# Patient Record
Sex: Male | Born: 1985 | Race: White | Hispanic: No | Marital: Married | Smoking: Never smoker
Health system: Southern US, Community
[De-identification: ages and names within clinical notes are randomized; demographics above are authoritative.]

---

## 2018-04-17 ENCOUNTER — Emergency Department (HOSPITAL_COMMUNITY): Payer: No Typology Code available for payment source

## 2018-04-17 ENCOUNTER — Emergency Department (HOSPITAL_COMMUNITY)
Admission: EM | Admit: 2018-04-17 | Discharge: 2018-04-17 | Disposition: A | Discharge status: Home / Self Care | Payer: No Typology Code available for payment source | Attending: Emergency Medicine | Admitting: Emergency Medicine

## 2018-04-17 ENCOUNTER — Encounter (HOSPITAL_COMMUNITY): Payer: Self-pay | Admitting: Emergency Medicine

## 2018-04-17 DIAGNOSIS — F419 Anxiety disorder, unspecified: Secondary | ICD-10-CM | POA: Insufficient documentation

## 2018-04-17 DIAGNOSIS — R110 Nausea: Secondary | ICD-10-CM

## 2018-04-17 DIAGNOSIS — Y92410 Unspecified street and highway as the place of occurrence of the external cause: Secondary | ICD-10-CM | POA: Insufficient documentation

## 2018-04-17 DIAGNOSIS — Y9389 Activity, other specified: Secondary | ICD-10-CM | POA: Insufficient documentation

## 2018-04-17 DIAGNOSIS — Y999 Unspecified external cause status: Secondary | ICD-10-CM | POA: Insufficient documentation

## 2018-04-17 DIAGNOSIS — R0789 Other chest pain: Secondary | ICD-10-CM | POA: Diagnosis not present

## 2018-04-17 MED ORDER — NAPROXEN 500 MG PO TABS: 500.0000 mg | ORAL_TABLET | Freq: Two times a day (BID) | ORAL | 0 refills | Status: DC | PRN

## 2018-04-17 MED ORDER — ONDANSETRON 4 MG PO TBDP: 4.0000 mg | ORAL_TABLET | Freq: Three times a day (TID) | ORAL | 0 refills | Status: DC | PRN
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
## 2018-07-30 ENCOUNTER — Ambulatory Visit: Payer: No Typology Code available for payment source | Admitting: Family Medicine

## 2018-08-13 ENCOUNTER — Ambulatory Visit: Payer: No Typology Code available for payment source | Admitting: Family Medicine
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
## 2019-04-27 ENCOUNTER — Ambulatory Visit (INDEPENDENT_AMBULATORY_CARE_PROVIDER_SITE_OTHER): Payer: No Typology Code available for payment source | Admitting: Family Medicine

## 2019-04-27 ENCOUNTER — Encounter: Payer: Self-pay | Admitting: Family Medicine

## 2019-04-27 ENCOUNTER — Other Ambulatory Visit: Payer: Self-pay

## 2019-04-27 VITALS — BP 118/72 | HR 88 | Temp 98.8°F | Resp 12 | Ht 72.0 in | Wt 153.6 lb

## 2019-04-27 DIAGNOSIS — F410 Panic disorder [episodic paroxysmal anxiety] without agoraphobia: Secondary | ICD-10-CM | POA: Insufficient documentation

## 2019-04-27 DIAGNOSIS — Z130 Encounter for screening for diseases of the blood and blood-forming organs and certain disorders involving the immune mechanism: Secondary | ICD-10-CM

## 2019-04-27 DIAGNOSIS — Z13228 Encounter for screening for other metabolic disorders: Secondary | ICD-10-CM | POA: Diagnosis not present

## 2019-04-27 DIAGNOSIS — Z13220 Encounter for screening for lipoid disorders: Secondary | ICD-10-CM

## 2019-04-27 DIAGNOSIS — R222 Localized swelling, mass and lump, trunk: Secondary | ICD-10-CM | POA: Diagnosis not present

## 2019-04-27 DIAGNOSIS — Z0000 Encounter for general adult medical examination without abnormal findings: Secondary | ICD-10-CM

## 2019-04-27 DIAGNOSIS — R7989 Other specified abnormal findings of blood chemistry: Secondary | ICD-10-CM

## 2019-04-27 DIAGNOSIS — F411 Generalized anxiety disorder: Secondary | ICD-10-CM

## 2019-04-27 DIAGNOSIS — Z1329 Encounter for screening for other suspected endocrine disorder: Secondary | ICD-10-CM | POA: Diagnosis not present

## 2019-04-27 LAB — BASIC METABOLIC PANEL
BUN: 15 mg/dL (ref 6–23)
CO2: 30 mEq/L (ref 19–32)
Calcium: 10.1 mg/dL (ref 8.4–10.5)
Chloride: 101 mEq/L (ref 96–112)
Creatinine, Ser: 0.99 mg/dL (ref 0.40–1.50)
GFR: 88.12 mL/min (ref >60.00)
Glucose, Bld: 95 mg/dL (ref 70–99)
Potassium: 4.1 mEq/L (ref 3.5–5.1)
Sodium: 140 mEq/L (ref 135–145)

## 2019-04-27 LAB — CBC WITH DIFFERENTIAL/PLATELET
Basophils Absolute: 0 10*3/uL (ref 0.0–0.1)
Basophils Relative: 0.4 % (ref 0.0–3.0)
Eosinophils Absolute: 0 10*3/uL (ref 0.0–0.7)
Eosinophils Relative: 0.3 % (ref 0.0–5.0)
HCT: 49.4 % (ref 39.0–52.0)
Hemoglobin: 17.2 g/dL — ABNORMAL HIGH (ref 13.0–17.0)
Lymphocytes Relative: 16.6 % (ref 12.0–46.0)
Lymphs Abs: 1.8 10*3/uL (ref 0.7–4.0)
MCHC: 34.8 g/dL (ref 30.0–36.0)
MCV: 88.3 fl (ref 78.0–100.0)
Monocytes Absolute: 0.8 10*3/uL (ref 0.1–1.0)
Monocytes Relative: 7.7 % (ref 3.0–12.0)
Neutro Abs: 8 10*3/uL — ABNORMAL HIGH (ref 1.4–7.7)
Neutrophils Relative %: 75 % (ref 43.0–77.0)
Platelets: 181 10*3/uL (ref 150.0–400.0)
RBC: 5.59 Mil/uL (ref 4.22–5.81)
RDW: 12.8 % (ref 11.5–15.5)
WBC: 10.6 10*3/uL — ABNORMAL HIGH (ref 4.0–10.5)

## 2019-04-27 LAB — LIPID PANEL
Cholesterol: 144 mg/dL (ref 0–200)
HDL: 56.6 mg/dL (ref >39.00)
LDL Cholesterol: 69 mg/dL (ref 0–99)
NonHDL: 86.97
Total CHOL/HDL Ratio: 3
Triglycerides: 88 mg/dL (ref 0.0–149.0)
VLDL: 17.6 mg/dL (ref 0.0–40.0)

## 2019-04-27 LAB — HEMOGLOBIN A1C: Hgb A1c MFr Bld: 4.6 % (ref 4.6–6.5)

## 2019-04-27 NOTE — Assessment & Plan Note (Signed)
Reporting problem as stable. He will benefit from cognitive behavior intervention. Continue following with psychiatrist.

## 2019-04-27 NOTE — Progress Notes (Signed)
HPI:   Jordan Neal is a 33 y.o. male, who is here today to establish care.  Former PCP: N/A Last preventive routine visit: At age 33  Chronic medical problems: Anxiety, fear to health problems.  States that he avoids going to the doctor because she is afraid something will be found. Currently he is on Xanax 0.5 mg daily as needed, he takes medication very seldom. He follows with psychiatrist periodically.  Otherwise healthy, negative for history of hypertension, hyperlipidemia, or diabetes.  Concerns today: None. He would like to have a CPE today.  He lives with his wife. He exercises regularly and follows a healthful diet. Because of family history of diabetes, he would like to have some labs today. No history of STDs.   On examination noted left supraclavicular fossa fullness, not noted by patient before.  He mentioned that for the past few days he has some achiness on area. Denies fever, chills, night sweats, or abnormal weight loss. Negative for cough, wheezing, dyspnea, chest pain.  Denies history of tobacco use or high alcohol intake.  He states that he feels "great."  Review of Systems  Constitutional: Negative for activity change, appetite change, fatigue, fever and unexpected weight change.  HENT: Negative for dental problem, nosebleeds, sore throat, trouble swallowing and voice change.   Eyes: Negative for redness and visual disturbance.  Respiratory: Negative for cough, shortness of breath and wheezing.   Cardiovascular: Negative for chest pain, palpitations and leg swelling.  Gastrointestinal: Negative for abdominal pain, blood in stool, nausea and vomiting.  Endocrine: Negative for cold intolerance, heat intolerance, polydipsia, polyphagia and polyuria.  Genitourinary: Negative for decreased urine volume, dysuria, genital sores, hematuria and testicular pain.  Musculoskeletal: Negative for joint swelling and myalgias.  Skin: Negative for color  change and rash.  Allergic/Immunologic: Negative for environmental allergies.  Neurological: Negative for dizziness, syncope, weakness, numbness and headaches.  Hematological: Negative for adenopathy. Does not bruise/bleed easily.  Psychiatric/Behavioral: Positive for sleep disturbance (Attributed to stress.). Negative for confusion. The patient is nervous/anxious.    Current Outpatient Medications on File Prior to Visit  Medication Sig Dispense Refill  . ALPRAZolam (XANAX) 0.5 MG tablet Take 0.5 mg by mouth daily as needed for anxiety.    . Melatonin 3 MG TABS Take by mouth at bedtime.     No current facility-administered medications on file prior to visit.      History reviewed. No pertinent past medical history. No Known Allergies  Family History  Problem Relation Age of Onset  . Diabetes Mother   . Cancer Father   . Heart attack Father   . Hyperlipidemia Father   . Arthritis Maternal Grandmother   . Depression Maternal Grandfather   . Diabetes Maternal Grandfather   . Hearing loss Maternal Grandfather   . Drug abuse Paternal Grandmother   . Hearing loss Paternal Grandmother   . Drug abuse Paternal Grandfather   . Heart attack Paternal Grandfather     Social History   Socioeconomic History  . Marital status: Married    Spouse name: Not on file  . Number of children: Not on file  . Years of education: Not on file  . Highest education level: Not on file  Occupational History  . Not on file  Social Needs  . Financial resource strain: Not on file  . Food insecurity    Worry: Not on file    Inability: Not on file  . Transportation needs  Medical: Not on file    Non-medical: Not on file  Tobacco Use  . Smoking status: Never Smoker  . Smokeless tobacco: Never Used  Substance and Sexual Activity  . Alcohol use: Yes    Comment: occasionally  . Drug use: Never  . Sexual activity: Not Currently  Lifestyle  . Physical activity    Days per week: Not on file     Minutes per session: Not on file  . Stress: Not on file  Relationships  . Social Herbalist on phone: Not on file    Gets together: Not on file    Attends religious service: Not on file    Active member of club or organization: Not on file    Attends meetings of clubs or organizations: Not on file    Relationship status: Not on file  Other Topics Concern  . Not on file  Social History Narrative  . Not on file    Vitals:   04/27/19 1040  BP: 118/72  Pulse: 88  Resp: 12  Temp: 98.8 F (37.1 C)  SpO2: 98%    Body mass index is 20.83 kg/m.  Physical Exam  Nursing note and vitals reviewed. Constitutional: He is oriented to person, place, and time. He appears well-developed and well-nourished. No distress.  HENT:  Head: Atraumatic.  Right Ear: Tympanic membrane, external ear and ear canal normal.  Left Ear: Tympanic membrane, external ear and ear canal normal.  Mouth/Throat: Oropharynx is clear and moist and mucous membranes are normal.  Eyes: Pupils are equal, round, and reactive to light. Conjunctivae and EOM are normal.  Neck: Normal range of motion. No tracheal deviation present. No thyromegaly present.  Cardiovascular: Normal rate and regular rhythm.  No murmur heard. Pulses:      Dorsalis pedis pulses are 2+ on the right side and 2+ on the left side.    Respiratory: Effort normal and breath sounds normal. No respiratory distress.  GI: Soft. He exhibits no mass. There is no hepatomegaly. There is no abdominal tenderness.  Genitourinary:    Genitourinary Comments: Refused,no concerns.   Musculoskeletal:        General: No tenderness or edema.     Cervical back: He exhibits spasm. He exhibits no edema.       Back:     Comments: No major deformities appreciated and no signs of synovitis.  Lymphadenopathy:    He has no cervical adenopathy.       Right: No supraclavicular adenopathy present.       Left: No supraclavicular (Fullness, no masses palpated.)  adenopathy present.  Neurological: He is alert and oriented to person, place, and time. He has normal strength. No cranial nerve deficit or sensory deficit. Coordination and gait normal.  Reflex Scores:      Bicep reflexes are 2+ on the right side and 2+ on the left side.      Patellar reflexes are 2+ on the right side and 2+ on the left side. Skin: Skin is warm. No erythema.  Psychiatric: His mood appears anxious. Cognition and memory are normal.    ASSESSMENT AND PLAN:  Jordan Neal was seen today for establish care and annual exam.  Orders Placed This Encounter  Procedures  . US SOFT TISSUE HEAD & NECK (NON-THYROID)  . Basic metabolic panel  . CBC with Differential  . Hemoglobin A1c  . Lipid panel   Lab Results  Component Value Date   CHOL 144 04/27/2019  HDL 56.60 04/27/2019   LDLCALC 69 04/27/2019   TRIG 88.0 04/27/2019   CHOLHDL 3 04/27/2019   Lab Results  Component Value Date   WBC 10.6 (H) 04/27/2019   HGB 17.2 (H) 04/27/2019   HCT 49.4 04/27/2019   MCV 88.3 04/27/2019   PLT 181.0 04/27/2019   Lab Results  Component Value Date   CREATININE 0.99 04/27/2019   BUN 15 04/27/2019   NA 140 04/27/2019   K 4.1 04/27/2019   CL 101 04/27/2019   CO2 30 04/27/2019   Lab Results  Component Value Date   WBC 10.6 (H) 04/27/2019   HGB 17.2 (H) 04/27/2019   HCT 49.4 04/27/2019   MCV 88.3 04/27/2019   PLT 181.0 04/27/2019   Lab Results  Component Value Date   HGBA1C 4.6 04/27/2019     Routine general medical examination at a health care facility We discussed the importance of regular physical activity and healthy diet for prevention of chronic illness and/or complications. Preventive guidelines reviewed. Vaccination up to date.  Next CPE in a year.  Supraclavicular fossa fullness -     CBC with Differential -     US SOFT TISSUE HEAD & NECK (NON-THYROID); Future  Screening for lipoid disorders -     Lipid panel  Screening for endocrine, metabolic and  immunity disorder -     Basic metabolic panel -     Hemoglobin A1c  Generalized anxiety disorder with panic attacks Reporting problem as stable. He will benefit from cognitive behavior intervention. Continue following with psychiatrist.   Return in 1 year (on 04/26/2020) for CPE.     Betty G. Swazil[place]Jordan, MD  [facility]. [place] office.

## 2019-04-27 NOTE — Patient Instructions (Signed)
A few things to remember from today's visit:   Supraclavicular fossa fullness - Plan: CBC with Differential  Routine general medical examination at a health care facility  Screening for lipoid disorders - Plan: Lipid panel  Screening for endocrine, metabolic and immunity disorder - Plan: Basic metabolic panel, Hemoglobin A1c    At least 150 minutes of moderate exercise per week, daily brisk walking for 15-30 min is a good exercise option. Healthy diet low in saturated (animal) fats and sweets and consisting of fresh fruits and vegetables, lean meats such as fish and white chicken and whole grains.  - Vaccines:  Tdap vaccine every 10 years.  Shingles vaccine recommended at age 76, could be given after 31 years of age but not sure about insurance coverage.  Pneumonia vaccines:  Prevnar 74 at 46 and Pneumovax at 6.   -Screening recommendations for low/normal risk males:  Screening for diabetes at age 2 and every 3 years. Earlier screening if cardiovascular risk factors.   Lipid screening at 35 and every 3 years. Screening starts in younger males with cardiovascular risk factors.  Colon cancer screening at age 81 and until age 72.  Prostate cancer screening: some controversy, starts usually at 14: Rectal exam and PSA.  Aortic Abdominal Aneurism once between 33 and 23 years old if ever smoker.  Also recommended:  1. Dental visit- Brush and floss your teeth twice daily; visit your dentist twice a year. 2. Eye doctor- Get an eye exam at least every 2 years. 3. Helmet use- Always wear a helmet when riding a bicycle, motorcycle, rollerblading or skateboarding. 4. Safe sex- If you may be exposed to sexually transmitted infections, use a condom. 5. Seat belts- Seat belts can save your live; always wear one. 6. Smoke/Carbon Monoxide detectors- These detectors need to be installed on the appropriate level of your home. Replace batteries at least once a year. 7. Skin cancer- When  out in the sun please cover up and use sunscreen 15 SPF or higher. 8. Violence- If anyone is threatening or hurting you, please tell your healthcare provider.  9. Drink alcohol in moderation- Limit alcohol intake to one drink or less per day. Never drink and drive.  Please be sure medication list is accurate. If a new problem present, please set up appointment sooner than planned today.

## 2019-04-30 ENCOUNTER — Other Ambulatory Visit: Payer: Self-pay | Admitting: Family Medicine

## 2019-05-11 ENCOUNTER — Ambulatory Visit
Admission: RE | Admit: 2019-05-11 | Discharge: 2019-05-11 | Disposition: A | Discharge status: Home / Self Care | Payer: No Typology Code available for payment source | Source: Ambulatory Visit | Attending: Family Medicine | Admitting: Family Medicine

## 2019-05-11 DIAGNOSIS — R222 Localized swelling, mass and lump, trunk: Secondary | ICD-10-CM

## 2019-05-13 ENCOUNTER — Telehealth: Payer: Self-pay | Admitting: *Deleted

## 2019-05-13 NOTE — Telephone Encounter (Signed)
Message sent to Dr. [place] for review. Labs haven't been viewed yet.  Copied from [place] [id number]. Topic: General - Inquiry >> May 13, 2019  3:53 PM Jordan Neal wrote: Reason for CRM:   Pt calling to find out ultrasound results from two days ago.

## 2019-05-14 NOTE — Telephone Encounter (Signed)
Patient is calling to check the status of nurse or pcp to get a call back regarding his ultrasound. Call back # [phone]

## 2019-05-18 NOTE — Telephone Encounter (Signed)
Patient given results and recommendations and verbalized understanding. 

## 2020-07-17 IMAGING — US SOFT TISSUE ULTRASOUND HEAD/NECK
1 series · 6 of 6 positions shown · non-contrast
Comparison: Chest radiograph 04/17/2018.

CLINICAL DATA: 33-year-old male with left supraclavicular fossa
palpable abnormality.

EXAM:
ULTRASOUND OF HEAD/NECK SOFT TISSUES
TECHNIQUE: Ultrasound examination of the head and neck soft tissues was
performed in the area of clinical concern.

[Series 1: soft tissue ultrasound head/neck · 0.04mm/px · 6 of 6 slices shown]
[im 1/6]
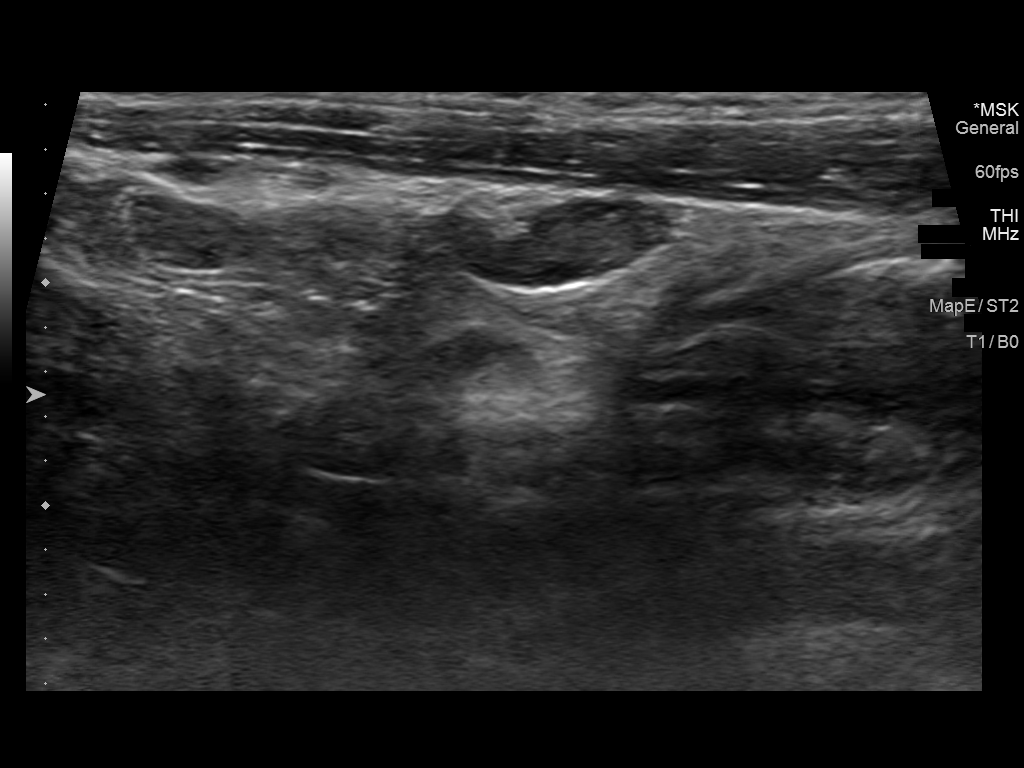
[im 2/6]
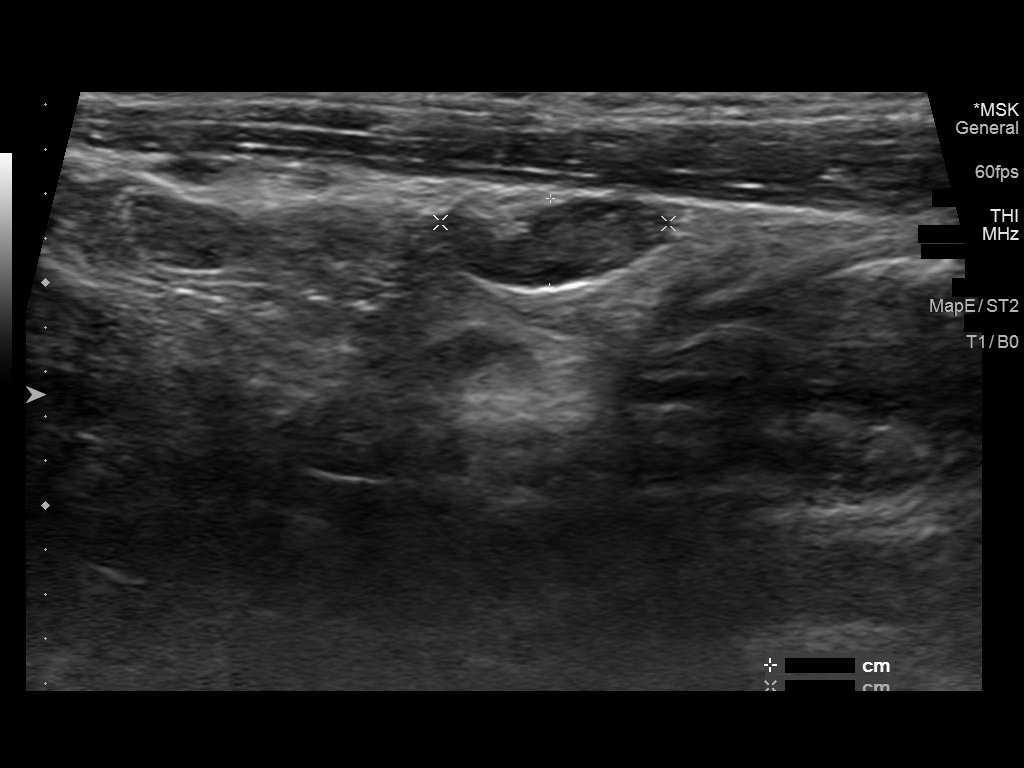
[im 3/6]
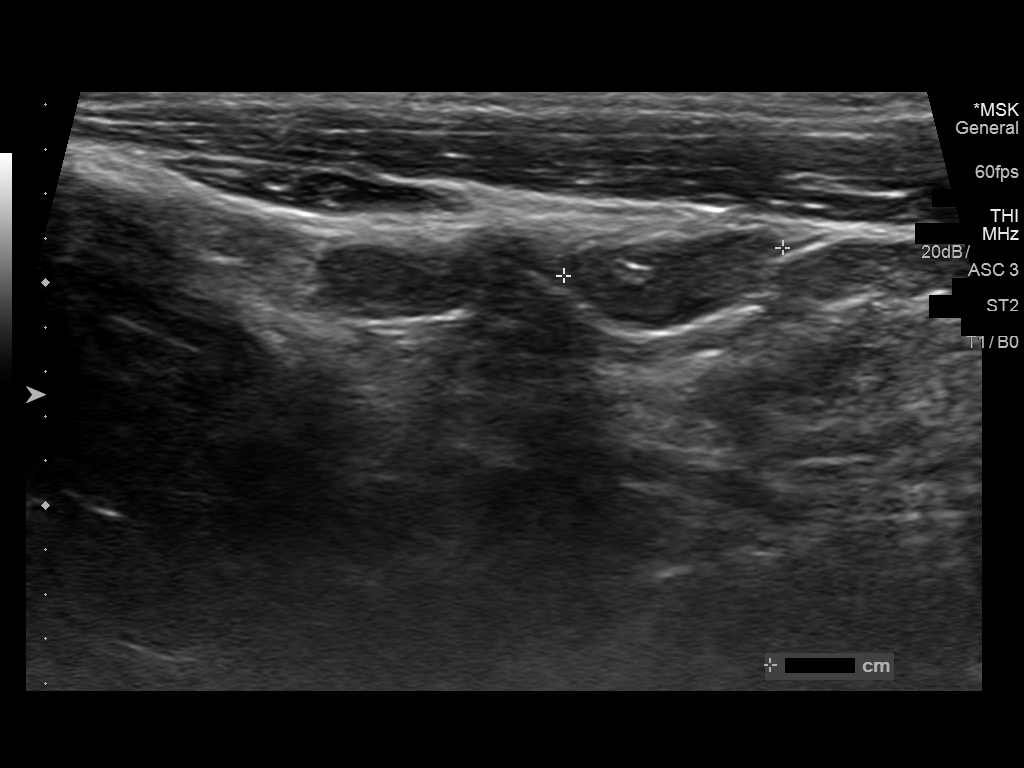
[im 4/6]
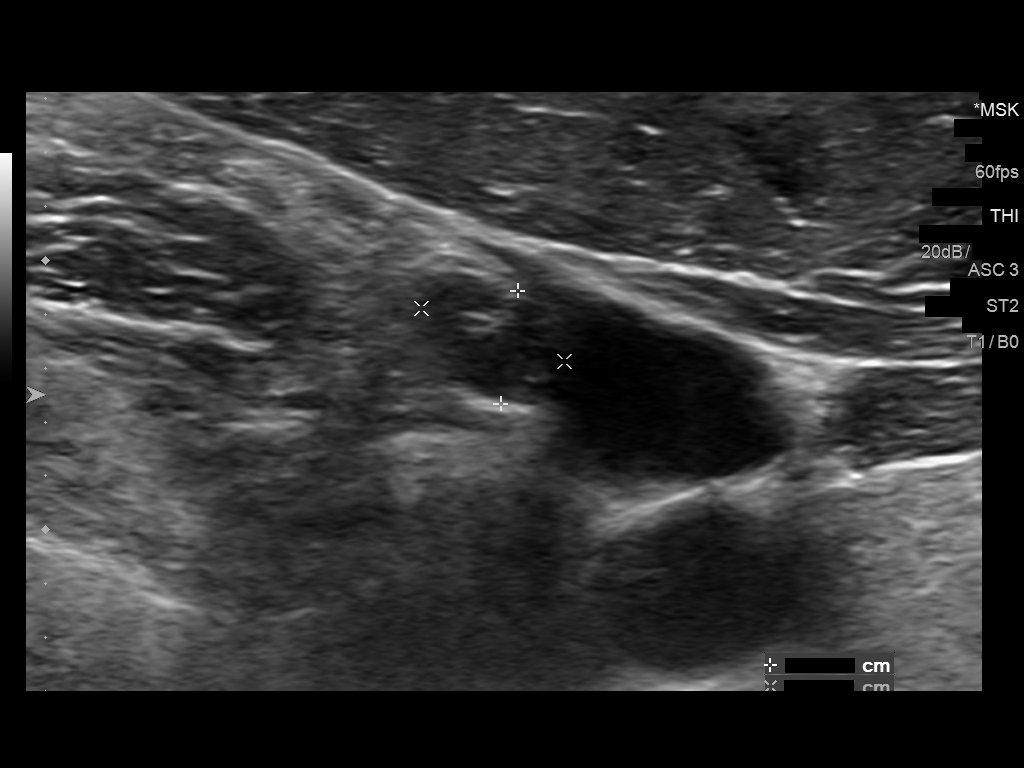
[im 5/6]
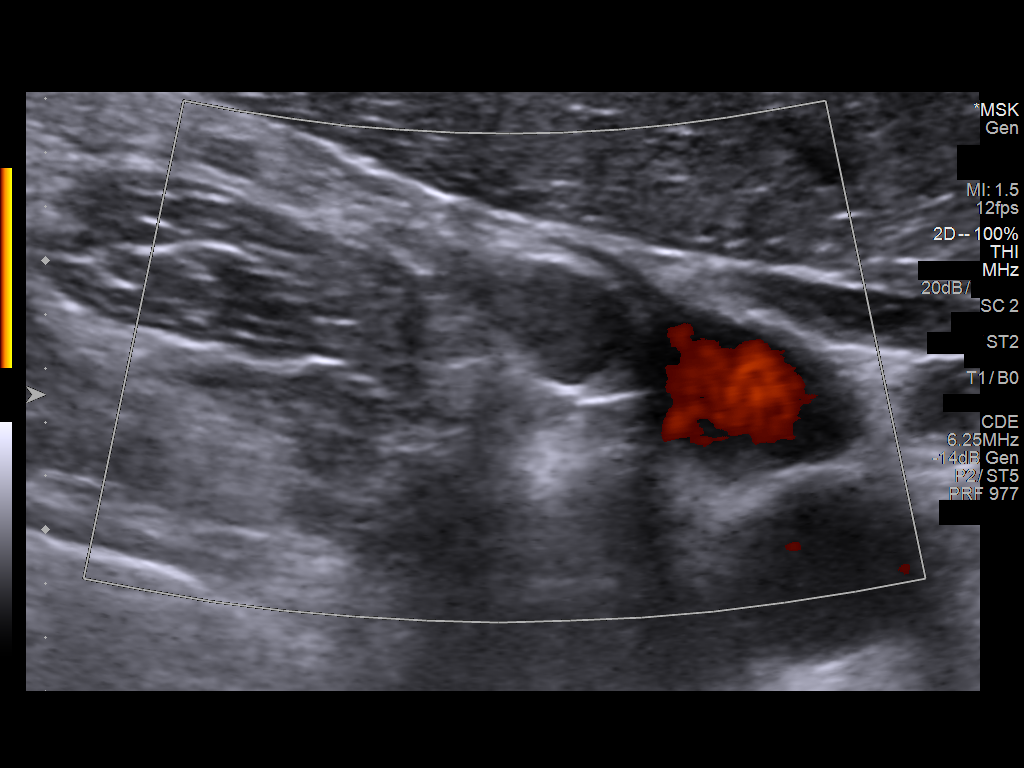
[im 6/6]
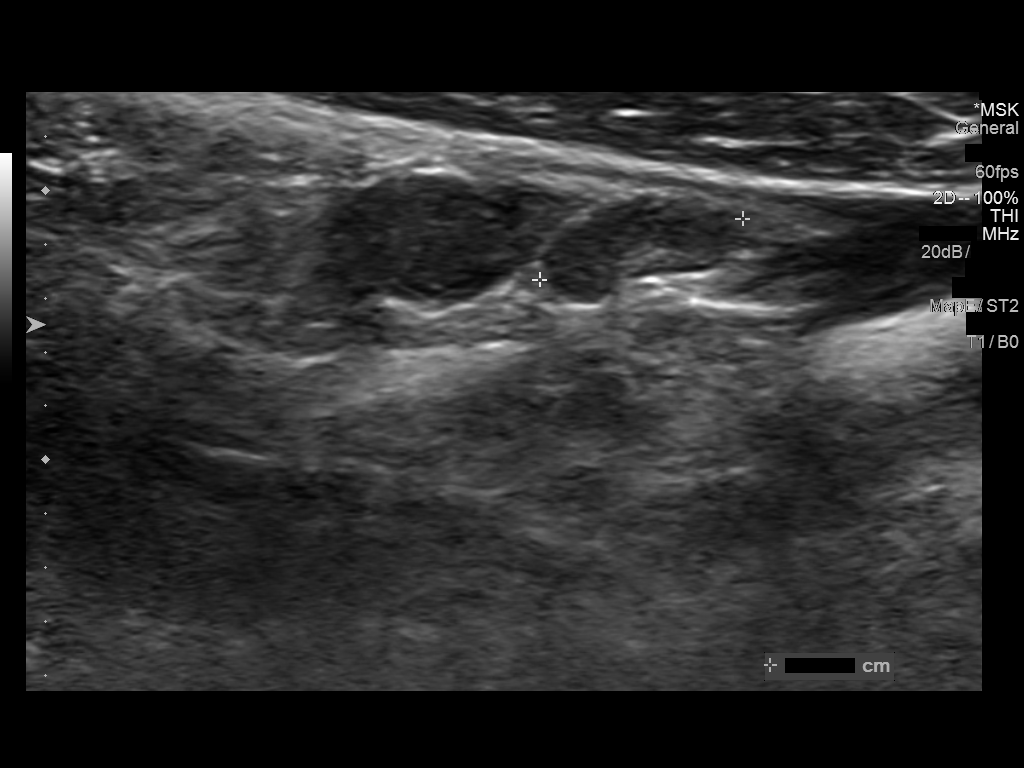

[6 of 6 positions shown; findings below may reference images not displayed]

FINDINGS: Several small lymph nodes are demonstrated in the left
supraclavicular area of fullness, measuring 4-5 millimeter short
axis and with normal fatty hila. The other regional fibromuscular
architecture appears normal.
IMPRESSION: Only several physiologic appearing lymph nodes are identified by
ultrasound in the area of palpable concern.
